# Patient Record
Sex: Male | Born: 1995 | Race: Black or African American | Hispanic: No | Marital: Single | Smoking: Current every day smoker
Health system: Southern US, Community
[De-identification: ages and names within clinical notes are randomized; demographics above are authoritative.]

---

## 2015-12-12 ENCOUNTER — Encounter (HOSPITAL_COMMUNITY): Payer: Self-pay | Admitting: Emergency Medicine

## 2015-12-12 ENCOUNTER — Emergency Department (HOSPITAL_COMMUNITY)
Admission: EM | Admit: 2015-12-12 | Discharge: 2015-12-12 | Disposition: A | Discharge status: Home / Self Care | Payer: No Typology Code available for payment source | Attending: Emergency Medicine | Admitting: Emergency Medicine

## 2015-12-12 ENCOUNTER — Emergency Department (HOSPITAL_COMMUNITY): Payer: No Typology Code available for payment source

## 2015-12-12 DIAGNOSIS — Y939 Activity, unspecified: Secondary | ICD-10-CM | POA: Insufficient documentation

## 2015-12-12 DIAGNOSIS — Y92410 Unspecified street and highway as the place of occurrence of the external cause: Secondary | ICD-10-CM | POA: Insufficient documentation

## 2015-12-12 DIAGNOSIS — S199XXA Unspecified injury of neck, initial encounter: Secondary | ICD-10-CM | POA: Insufficient documentation

## 2015-12-12 DIAGNOSIS — F17210 Nicotine dependence, cigarettes, uncomplicated: Secondary | ICD-10-CM | POA: Diagnosis not present

## 2015-12-12 DIAGNOSIS — Y999 Unspecified external cause status: Secondary | ICD-10-CM | POA: Diagnosis not present

## 2015-12-12 MED ORDER — METHOCARBAMOL 500 MG PO TABS: 500.0000 mg | ORAL_TABLET | Freq: Two times a day (BID) | ORAL | 0 refills | Status: AC

## 2015-12-12 MED ORDER — ACETAMINOPHEN 500 MG PO TABS
1000.0000 mg | ORAL_TABLET | Freq: Once | ORAL | Status: AC
Start: 2015-12-12 — End: 2015-12-12
  Administered 2015-12-12: 1000 mg via ORAL
  Filled 2015-12-12: qty 2

## 2015-12-12 MED ORDER — NAPROXEN 375 MG PO TABS: 375.0000 mg | ORAL_TABLET | Freq: Two times a day (BID) | ORAL | 0 refills | Status: AC

## 2015-12-12 MED ORDER — NAPROXEN 250 MG PO TABS
500.0000 mg | ORAL_TABLET | Freq: Once | ORAL | Status: AC
  Administered 2015-12-12: 500 mg via ORAL
  Filled 2015-12-12: qty 2

## 2015-12-12 MED ORDER — METHOCARBAMOL 500 MG PO TABS
1000.0000 mg | ORAL_TABLET | Freq: Once | ORAL | Status: AC
  Administered 2015-12-12: 1000 mg via ORAL
  Filled 2015-12-12: qty 2
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
## 2015-12-12 NOTE — ED Notes (Signed)
Pt departed in NAD, refused use of wheelchair.  
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
## 2015-12-12 NOTE — ED Notes (Signed)
Patient transported to X-ray 
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
